# Patient Record
Sex: Male | Born: 1995 | Race: White | Hispanic: No | Marital: Married
Health system: Southern US, Community
[De-identification: ages and names within clinical notes are randomized; demographics above are authoritative.]

## PROBLEM LIST (undated history)

## (undated) HISTORY — PX: TONSILLECTOMY AND ADENOIDECTOMY: SUR1326

---
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
## 2021-07-21 NOTE — Progress Notes (Signed)
? ? ? ?07/22/2021 ?[name] ?[id number] ?[birth year] ? ? ?CHIEF COMPLAINT: Irritable bowel symptoms ? 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 S/P tonsillectomy and adenoidectomy in 2008.  He presents to our office today self referred for further evaluation for abdominal gas and diarrhea.  He felt well until he contracted COVID-19 infection 04/2021.  He developed abdominal gas bloat with intermittent urgent diarrhea.  No bloody diarrhea.  No associated abdominal pain.  No specific food triggers.  One day Feb. 2023, he was driving and stopped at [facility] to pass an urgent diarrhea bowel movement.  A similar episode occurred 1 week later.  He endorsed drinking 2-3 beers during the weekdays and sometimes has 3 or 4 beers on the weekends.  He was drinking whey protein milk at nighttime.  He elected to change his diet.  He reduced his alcohol intake significantly.  He stopped using drinking whey protein with milk.  His diarrhea episodes decreased but did not completely abate.  His last episode of diarrhea occurred while he was playing golf on Saturday 07/17/2021. He was drinking a preworkout energy drink which consisted of 120 mg of caffeine which he recently discontinued as well. His bowel movements have improved over the past few days.  He passed a normal formed brown bowel movement yesterday and today he passed a thin formed stool.  No antibiotics within the past 6 months.  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  He takes creatine supplement.  Mother with history of IBS.  No known family history of IBD or celiac disease.  He exercises 2 hours most days.  He is intentionally trying to lose weight.  He is in the process of relocating to [place] due to new employment opportunity. ? ?Social History: He is single.  Currently unemployed.  See HPI regarding alcohol intake.  No drug use. ? ?Family History: Mother with IBS.   No family history of esophageal, gastric or colorectal cancer.   ? ?Allergies  ?Allergen Reactions  ? Grass Extracts [Gramineae Pollens]   ? ?  ?Outpatient Encounter Medications as of 07/22/2021  ?Medication Sig  ? finasteride (PROPECIA) 1 MG tablet Take 1 mg by mouth daily.  ? ?No facility-administered encounter medications on file as of 07/22/2021.  ? ?REVIEW OF SYSTEMS:  ?Gen: Denies fever, sweats or chills. + Intentional weight loss.  ?CV: Denies chest pain, palpitations or edema. ?Resp: Denies cough, shortness of breath of hemoptysis.  ?GI: See HPI. No GERD symptoms.  ?GU : Denies urinary burning, blood in urine, increased urinary frequency or incontinence. ?MS: Denies joint pain, muscles aches or weakness. ?Derm: Denies rash, itchiness, skin lesions or unhealing ulcers. ?Psych: Denies depression, anxiety or memory loss. ?Heme: Denies bruising, easy bleeding. ?Neuro:  Denies headaches, dizziness or paresthesias. ?Endo:  Denies any problems with DM, thyroid or adrenal function. ? ?PHYSICAL EXAM: ?BP 120/80   Pulse 79   Ht 5' 11.75" (1.822 m)   Wt 211 lb (95.7 kg)   BMI 28.82 kg/m?  ? ?Wt Readings from Last 3 Encounters:  ?07/22/21 211 lb (95.7 kg)  ?02/17/11 153 lb (69.4 kg) (96 %, Z= 1.75)*  ? ?* Growth percentiles are based on CDC (Boys, 2-20 Years) data.  ?  ?General: 26 year old male in no acute distress. ?Head: Normocephalic and atraumatic. ?Eyes:  Sclerae non-icteric, conjunctive pink. ?Ears: Normal auditory acuity. ?Mouth: Dentition intact. No ulcers or lesions.  ?Neck:  Supple, no lymphadenopathy or thyromegaly.  ?Lungs: Clear bilaterally to auscultation without wheezes, crackles or rhonchi. ?Heart: Regular rate and rhythm. No murmur, rub or gallop appreciated.  ?Abdomen: Soft, nontender, non distended. No masses. No hepatosplenomegaly. Normoactive bowel sounds x 4 quadrants.  ?Rectal: Deferred. ?Musculoskeletal: Symmetrical with no gross deformities. ?Skin: Warm and dry. No rash or lesions on visible  extremities. ?Extremities: No edema. ?Neurological: Alert oriented x 4, no focal deficits.  ?Psychological:  Alert and cooperative. Normal mood and affect. ? ?ASSESSMENT AND PLAN: ? ?76) 26 year old male with intermittent nonbloody urgent diarrhea which has improved after discontinuing a pre-work out drink containing 120mg  of caffeine, stopped drinking a protein whey drink with milk at bed time and after he significantly reduced alcohol intake.  ?-TTG, IgA, TSH, CBC, CRP and CMP ?-Fecal calprotectin level  ?-Continue to monitor GI symptoms ?-Continue to avoid caffeinated preworkout drinks and protein whey. Limit alcohol intake.  ?-Follow-up in 4 to 6 weeks, if urgent diarrhea episodes persist I discussed scheduling a diagnostic colonoscopy to rule out colitis/IBD ? ? ? ?CC:  No ref. provider found ? ? ? ?

## 2021-07-22 ENCOUNTER — Ambulatory Visit (INDEPENDENT_AMBULATORY_CARE_PROVIDER_SITE_OTHER): Payer: 59 | Admitting: Nurse Practitioner

## 2021-07-22 ENCOUNTER — Other Ambulatory Visit (INDEPENDENT_AMBULATORY_CARE_PROVIDER_SITE_OTHER): Payer: 59

## 2021-07-22 ENCOUNTER — Other Ambulatory Visit: Payer: 59

## 2021-07-22 ENCOUNTER — Encounter: Payer: Self-pay | Admitting: Nurse Practitioner

## 2021-07-22 VITALS — BP 120/80 | HR 79 | Ht 71.75 in | Wt 211.0 lb

## 2021-07-22 DIAGNOSIS — R140 Abdominal distension (gaseous): Secondary | ICD-10-CM

## 2021-07-22 DIAGNOSIS — R197 Diarrhea, unspecified: Secondary | ICD-10-CM

## 2021-07-22 LAB — COMPREHENSIVE METABOLIC PANEL
ALT: 22 U/L (ref 0–53)
AST: 20 U/L (ref 0–37)
Albumin: 4.7 g/dL (ref 3.5–5.2)
Alkaline Phosphatase: 58 U/L (ref 39–117)
BUN: 17 mg/dL (ref 6–23)
CO2: 29 mEq/L (ref 19–32)
Calcium: 9.5 mg/dL (ref 8.4–10.5)
Chloride: 103 mEq/L (ref 96–112)
Creatinine, Ser: 1.05 mg/dL (ref 0.40–1.50)
GFR: 99.93 mL/min (ref >60.00)
Glucose, Bld: 91 mg/dL (ref 70–99)
Potassium: 4.6 mEq/L (ref 3.5–5.1)
Sodium: 140 mEq/L (ref 135–145)
Total Bilirubin: 0.7 mg/dL (ref 0.2–1.2)
Total Protein: 7.2 g/dL (ref 6.0–8.3)

## 2021-07-22 LAB — CBC WITH DIFFERENTIAL/PLATELET
Basophils Absolute: 0 10*3/uL (ref 0.0–0.1)
Basophils Relative: 0.5 % (ref 0.0–3.0)
Eosinophils Absolute: 0.1 10*3/uL (ref 0.0–0.7)
Eosinophils Relative: 2.4 % (ref 0.0–5.0)
HCT: 47.8 % (ref 39.0–52.0)
Hemoglobin: 16 g/dL (ref 13.0–17.0)
Lymphocytes Relative: 28.8 % (ref 12.0–46.0)
Lymphs Abs: 1.5 10*3/uL (ref 0.7–4.0)
MCHC: 33.4 g/dL (ref 30.0–36.0)
MCV: 88.3 fl (ref 78.0–100.0)
Monocytes Absolute: 0.4 10*3/uL (ref 0.1–1.0)
Monocytes Relative: 7.2 % (ref 3.0–12.0)
Neutro Abs: 3.3 10*3/uL (ref 1.4–7.7)
Neutrophils Relative %: 61.1 % (ref 43.0–77.0)
Platelets: 220 10*3/uL (ref 150.0–400.0)
RBC: 5.41 Mil/uL (ref 4.22–5.81)
RDW: 13 % (ref 11.5–15.5)
WBC: 5.3 10*3/uL (ref 4.0–10.5)

## 2021-07-22 LAB — C-REACTIVE PROTEIN: CRP: <1 mg/dL (ref 0.5–20.0)

## 2021-07-22 LAB — TSH: TSH: 1.32 u[IU]/mL (ref 0.35–5.50)

## 2021-07-22 NOTE — Patient Instructions (Addendum)
Please proceed to the basement level for lab work and pick up stool test before leaving today. Press "B" on the elevator. The lab is located at the first door on the left as you exit the elevator. ? ?HEALTHCARE LAWS AND MY CHART RESULTS:  ? ?Due to recent changes in healthcare laws, you may see results of your imaging and/or laboratory studies on MyChart before I have had a chance to review them.  I understand that in some cases there may be results that are confusing or concerning to you. Please understand that not all results are received at the same time and often I may need to interpret multiple results in order to provide you with the best plan of care or course of treatment. Therefore, I ask that you please give me 48 hours to thoroughly review all your results before contacting my office for clarification.  ? ?IBgard 1 capsule twice a day as needed for abdominal bloating/discomfort. ?Follow up in 4-6 weeks. ? ?Thank you for trusting me with your gastrointestinal care!   ? ?Arnaldo Natal, CRNP ? ? ? ?BMI: ? ?If you are age 32 or older, your body mass index should be between 23-30. Your Body mass index is 28.82 kg/m?Marland Kitchen If this is out of the aforementioned range listed, please consider follow up with your Primary Care Provider. ? ?If you are age 16 or younger, your body mass index should be between 19-25. Your Body mass index is 28.82 kg/m?Marland Kitchen If this is out of the aformentioned range listed, please consider follow up with your Primary Care Provider.  ? ?MY CHART: ? ?The [place] [facility] providers would like to encourage you to use [facility] to communicate with providers for non-urgent requests or questions.  Due to long hold times on the telephone, sending your provider a message by [facility] may be a faster and more efficient way to get a response.  Please allow 48 business hours for a response.  Please remember that this is for non-urgent requests.  ? ?

## 2021-07-22 NOTE — Progress Notes (Signed)
Agree with the assessment and plan as outlined by Colleen Kennedy-Smith, NP.    Carliyah Cotterman E. Aven Christen, MD [facility]  

## 2021-07-23 LAB — IGA: Immunoglobulin A: 130 mg/dL (ref 47–310)

## 2021-07-23 LAB — TISSUE TRANSGLUTAMINASE ABS,IGG,IGA
(tTG) Ab, IgA: <1 U/mL
(tTG) Ab, IgG: <1 U/mL

## 2021-07-26 LAB — CALPROTECTIN, FECAL: Calprotectin, Fecal: <16 ug/g (ref 0–120)
